# Patient Record
Sex: Female | Born: 1955 | Race: White | Hispanic: No | Marital: Married | Smoking: Never smoker
Health system: Southern US, Community
[De-identification: ages and names within clinical notes are randomized; demographics above are authoritative.]

## PROBLEM LIST (undated history)

## (undated) DIAGNOSIS — N390 Urinary tract infection, site not specified: Secondary | ICD-10-CM

## (undated) DIAGNOSIS — D696 Thrombocytopenia, unspecified: Secondary | ICD-10-CM

## (undated) DIAGNOSIS — B182 Chronic viral hepatitis C: Secondary | ICD-10-CM

## (undated) HISTORY — DX: Thrombocytopenia, unspecified: D69.6

## (undated) HISTORY — DX: Chronic viral hepatitis C: B18.2

## (undated) HISTORY — DX: Urinary tract infection, site not specified: N39.0

---

## 1998-05-30 ENCOUNTER — Other Ambulatory Visit: Admission: RE | Admit: 1998-05-30 | Discharge: 1998-05-30 | Payer: Self-pay | Admitting: Obstetrics and Gynecology

## 1999-12-10 ENCOUNTER — Other Ambulatory Visit: Admission: RE | Admit: 1999-12-10 | Discharge: 1999-12-10 | Payer: Self-pay | Admitting: Obstetrics and Gynecology

## 2000-11-09 ENCOUNTER — Encounter (INDEPENDENT_AMBULATORY_CARE_PROVIDER_SITE_OTHER): Payer: Self-pay

## 2000-11-09 ENCOUNTER — Ambulatory Visit (HOSPITAL_COMMUNITY): Admission: RE | Admit: 2000-11-09 | Discharge: 2000-11-09 | Payer: Self-pay | Admitting: General Surgery

## 2000-12-28 ENCOUNTER — Other Ambulatory Visit: Admission: RE | Admit: 2000-12-28 | Discharge: 2000-12-28 | Payer: Self-pay | Admitting: Obstetrics and Gynecology

## 2002-01-04 ENCOUNTER — Encounter: Payer: Self-pay | Admitting: Obstetrics and Gynecology

## 2002-01-04 ENCOUNTER — Encounter: Admission: RE | Admit: 2002-01-04 | Discharge: 2002-01-04 | Payer: Self-pay | Admitting: Obstetrics and Gynecology

## 2002-03-13 ENCOUNTER — Other Ambulatory Visit: Admission: RE | Admit: 2002-03-13 | Discharge: 2002-03-13 | Payer: Self-pay | Admitting: Obstetrics and Gynecology

## 2002-10-27 ENCOUNTER — Ambulatory Visit (HOSPITAL_COMMUNITY): Admission: RE | Admit: 2002-10-27 | Discharge: 2002-10-27 | Payer: Self-pay | Admitting: *Deleted

## 2003-05-16 ENCOUNTER — Encounter: Admission: RE | Admit: 2003-05-16 | Discharge: 2003-05-16 | Payer: Self-pay | Admitting: Obstetrics and Gynecology

## 2003-06-19 ENCOUNTER — Other Ambulatory Visit: Admission: RE | Admit: 2003-06-19 | Discharge: 2003-06-19 | Payer: Self-pay | Admitting: Obstetrics and Gynecology

## 2003-10-31 ENCOUNTER — Encounter (INDEPENDENT_AMBULATORY_CARE_PROVIDER_SITE_OTHER): Payer: Self-pay | Admitting: Specialist

## 2003-10-31 ENCOUNTER — Ambulatory Visit (HOSPITAL_BASED_OUTPATIENT_CLINIC_OR_DEPARTMENT_OTHER): Admission: RE | Admit: 2003-10-31 | Discharge: 2003-10-31 | Payer: Self-pay | Admitting: General Surgery

## 2003-10-31 ENCOUNTER — Ambulatory Visit (HOSPITAL_COMMUNITY): Admission: RE | Admit: 2003-10-31 | Discharge: 2003-10-31 | Payer: Self-pay | Admitting: General Surgery

## 2003-11-29 ENCOUNTER — Encounter: Admission: RE | Admit: 2003-11-29 | Discharge: 2003-11-29 | Payer: Self-pay | Admitting: *Deleted

## 2004-02-19 ENCOUNTER — Ambulatory Visit: Payer: Self-pay | Admitting: Internal Medicine

## 2004-03-05 ENCOUNTER — Ambulatory Visit: Payer: Self-pay | Admitting: Internal Medicine

## 2004-04-03 ENCOUNTER — Ambulatory Visit: Payer: Self-pay | Admitting: Gastroenterology

## 2005-09-24 ENCOUNTER — Ambulatory Visit: Payer: Self-pay | Admitting: Gastroenterology

## 2005-10-19 ENCOUNTER — Ambulatory Visit (HOSPITAL_COMMUNITY): Admission: RE | Admit: 2005-10-19 | Discharge: 2005-10-19 | Payer: Self-pay | Admitting: Gastroenterology

## 2005-11-30 ENCOUNTER — Ambulatory Visit (HOSPITAL_COMMUNITY): Admission: RE | Admit: 2005-11-30 | Discharge: 2005-11-30 | Payer: Self-pay | Admitting: Gastroenterology

## 2007-07-25 ENCOUNTER — Ambulatory Visit: Payer: Self-pay | Admitting: Family Medicine

## 2007-07-27 ENCOUNTER — Encounter: Admission: RE | Admit: 2007-07-27 | Discharge: 2007-07-27 | Payer: Self-pay | Admitting: Obstetrics and Gynecology

## 2008-07-30 ENCOUNTER — Encounter: Admission: RE | Admit: 2008-07-30 | Discharge: 2008-07-30 | Payer: Self-pay | Admitting: Obstetrics and Gynecology

## 2009-08-19 ENCOUNTER — Encounter: Admission: RE | Admit: 2009-08-19 | Discharge: 2009-08-19 | Payer: Self-pay | Admitting: Obstetrics and Gynecology

## 2009-10-24 ENCOUNTER — Encounter: Payer: Self-pay | Admitting: Family Medicine

## 2009-10-24 LAB — CONVERTED CEMR LAB: Pap Smear: NORMAL

## 2009-10-24 LAB — HM PAP SMEAR

## 2010-05-30 ENCOUNTER — Ambulatory Visit (INDEPENDENT_AMBULATORY_CARE_PROVIDER_SITE_OTHER): Payer: BC Managed Care – PPO | Admitting: Family Medicine

## 2010-05-30 ENCOUNTER — Encounter: Payer: Self-pay | Admitting: Family Medicine

## 2010-05-30 DIAGNOSIS — N390 Urinary tract infection, site not specified: Secondary | ICD-10-CM

## 2010-05-30 DIAGNOSIS — R300 Dysuria: Secondary | ICD-10-CM

## 2010-05-30 DIAGNOSIS — B1710 Acute hepatitis C without hepatic coma: Secondary | ICD-10-CM | POA: Insufficient documentation

## 2010-05-30 LAB — CONVERTED CEMR LAB
Bilirubin Urine: NEGATIVE
Glucose, Urine, Semiquant: NEGATIVE
Ketones, urine, test strip: NEGATIVE
pH: 5

## 2010-05-31 ENCOUNTER — Encounter: Payer: Self-pay | Admitting: Family Medicine

## 2010-06-04 NOTE — Assessment & Plan Note (Signed)
Summary: NEW PT TO EST/?UTI/CLE  [employer]   Vital Signs:  Patient profile:   55 year old female Height:      65 inches Weight:      179.25 pounds BMI:     29.94 Temp:     98.5 degrees F oral Pulse rate:   86 / minute Pulse rhythm:   regular BP sitting:   118 / 82  (right arm) Cuff size:   regular  Vitals Entered By: Linde Gillis CMA Duncan Dull) (May 30, 2010 10:49 AM) CC: new patient, ? UTI   History of Present Illness: New pt here for ?UTI.  Woke up this morning, increased urinary frequency, nausea, chills. +dysuria, +hematuria. No back pain.  No h/o UTIs in past.  Hep C- diagnosed 7 years ago, she is unsure how she was exposed. Followed by [facility] (awaiting records).  No current meds.  Preventive Screening-Counseling & Management  Alcohol-Tobacco     Smoking Status: never  Current Medications (verified): 1)  Cipro 500 Mg Tabs (Ciprofloxacin Hcl) .Marland Kitchen.. 1 By Mouth 2 Times Daily X 7 Days 2)  Pyridium 100 Mg Tabs (Phenazopyridine Hcl) .Marland Kitchen.. 1 Tab By Mouth Three Times A Day X 2 Days  Allergies (verified): No Known Drug Allergies  Past History:  Family History: Last updated: 05/30/2010 Family History High cholesterol  Social History: Last updated: 05/30/2010 Married Never Smoked Alcohol use-yes  Risk Factors: Smoking Status: never (05/30/2010)  Past Medical History: Hepatitis C Thrombocytopenia  Past Surgical History: Denies surgical history  Family History: Family History High cholesterol  Social History: Married Never Smoked Alcohol use-yes Smoking Status:  never  Review of Systems      See HPI General:  Complains of chills. GI:  Complains of abdominal pain and nausea; denies vomiting. GU:  Complains of dysuria, hematuria, and urinary frequency.  Physical Exam  General:  Well-developed,well-nourished,in no acute distress; alert,appropriate and cooperative throughout examination VSS. non toxic appearing Abdomen:  soft.   pos  suprapubic tenderness Msk:  no CVA tenderness Neurologic:  alert & oriented X3 and gait normal.   Skin:  Intact without suspicious lesions or rashes Psych:  Cognition and judgment appear intact. Alert and cooperative with normal attention span and concentration. No apparent delusions, illusions, hallucinations   Impression & Recommendations:  Problem # 1:  URINARY TRACT INFECTION (ICD-599.0) Assessment New complicated.  will treat for presumed pyelonephritis with cipro 500 mg two times a day x 7days. send for culture. pyridium for dysuria. Her updated medication list for this problem includes:    Cipro 500 Mg Tabs (Ciprofloxacin hcl) .Marland Kitchen... 1 by mouth 2 times daily x 7 days    Pyridium 100 Mg Tabs (Phenazopyridine hcl) .Marland Kitchen... 1 tab by mouth three times a day x 2 days  Complete Medication List: 1)  Cipro 500 Mg Tabs (Ciprofloxacin hcl) .Marland Kitchen.. 1 by mouth 2 times daily x 7 days 2)  Pyridium 100 Mg Tabs (Phenazopyridine hcl) .Marland Kitchen.. 1 tab by mouth three times a day x 2 days  Other Orders: Specimen Handling (19147) T-Urine Culture (Spectrum Order) [id number]) UA Dipstick w/o Micro (manual) (81002) Prescriptions: PYRIDIUM 100 MG TABS (PHENAZOPYRIDINE HCL) 1 tab by mouth three times a day x 2 days  #6 x 0   Entered and Authorized by:   Ruthe Mannan MD   Signed by:   Ruthe Mannan MD on 05/30/2010   Method used:   Print then Give to Patient   RxID:   [id number] CIPRO 500 MG TABS (  CIPROFLOXACIN HCL) 1 by mouth 2 times daily x 7 days  #14 x 0   Entered and Authorized by:   Ruthe Mannan MD   Signed by:   Ruthe Mannan MD on 05/30/2010   Method used:   Print then Give to Patient   RxID:   [id number]    Orders Added: 1)  Specimen Handling [99000] 2)  T-Urine Culture (Spectrum Order) [[id number]] 3)  UA Dipstick w/o Micro (manual) [81002] 4)  New Patient Level II [99202]    Prior Medications (reviewed today): None Current Allergies (reviewed today): No known allergies   PAP  Result Date:  10/24/2009 PAP Result:  normal historical  Laboratory Results   Urine Tests  Date/Time Received: May 30, 2010 11:02 AM   Routine Urinalysis   Color: brown Appearance: Cloudy Glucose: negative   (Normal Range: Negative) Bilirubin: negative   (Normal Range: Negative) Ketone: negative   (Normal Range: Negative) Spec. Gravity: >=1.030   (Normal Range: 1.003-1.035) Blood: large   (Normal Range: Negative) pH: 5.0   (Normal Range: 5.0-8.0) Protein: >=300   (Normal Range: Negative) Urobilinogen: 0.2   (Normal Range: 0-1) Nitrite: positive   (Normal Range: Negative) Leukocyte Esterace: large   (Normal Range: Negative)

## 2010-08-29 NOTE — Op Note (Signed)
[facility]  Patient:    Chelsea Park, Chelsea Park                        MRN: [id number] Proc. Date: 11/09/00 Adm. Date:  [id number] Attending:  Carson Myrtle CC:         Eliberto Ivory. Rosalio Macadamia, M.D.   Operative Report  PREOPERATIVE DIAGNOSIS:  Anal stenosis, anal fissure, and external hemorrhoids.  POSTOPERATIVE DIAGNOSIS:  Anal stenosis, anal fissure, and external hemorrhoids.  PROCEDURE:  Repair of anal stenosis and fissure, and external hemorrhoidectomy.  SURGEON:  Timothy E. Earlene Plater, M.D.  ANESTHESIA:  General.  INDICATIONS:  The patient has been seen and followed for some months for chronic recurrent anal pain, discomfort, bleeding, and anal fissure, difficulty with stools.  Even after correction of bowel habits, she is still plagued with symptoms.  After careful explanation, she wishes to proceed.  DESCRIPTION OF PROCEDURE:  The patient was prepared at home and brought to the operating room, and placed supine, and an LMA anesthesia provided.  She was placed in lithotomy.  The perianal carefully inspected, prepped and draped in the usual fashion.  Hemorrhoids were prominent in the left anterior, left posterior, and right posterior positions.  The fissure was posteriorly and partially _______.  Stenosis was moderate.  The anus was injected around and about with Marcaine, epinephrine, and Wydase, and massaged in well.  A left posterior percutaneous internal sphincterotomy accomplished with a 15 blade. This allowed for some further dilatation, and insertion of the operating anoscope.  Each of the external hemorrhoids were successively removed, undermining the edges and closure with 3-0 Prolene.  The left anterior external hemorrhoid was closed circumferentially.  This completed the procedure.  There was no other pathology.  No bleeding or complications. Gelfoam gauze and a dry sterile dressing applied.  She tolerated it well and was removed to the  recovery room in good condition. Written and verbal instructions including Percocet were given to her and her husband, and she will be followed as an outpatient. DD:  11/09/00 TD:  11/09/00 Job: [id number]

## 2010-08-29 NOTE — Op Note (Signed)
Chelsea Park, Chelsea Park                           ACCOUNT NO.:  [id number]   MEDICAL RECORD NO.:  [id number]                   PATIENT TYPE:  AMB   LOCATION:  [facility]                                 FACILITY:  [facility]   PHYSICIAN:  Timothy E. Earlene Plater, M.D.              DATE OF BIRTH:  23-Jan-1956   DATE OF PROCEDURE:  DATE OF DISCHARGE:                                 OPERATIVE REPORT   No dictation.                                               Timothy E. Earlene Plater, M.D.    TED/MEDQ  D:  10/31/2003  T:  10/31/2003  Job:  [id number]

## 2010-08-29 NOTE — Op Note (Signed)
NAMEBOWEN, Chelsea Park                 ACCOUNT NO.:  [id number]   MEDICAL RECORD NO.:  [id number]          PATIENT TYPE:  AMB   LOCATION:  ENDO                         FACILITY:  [facility]   PHYSICIAN:  Petra Kuba, M.D.    DATE OF BIRTH:  Feb 20, 1956   DATE OF PROCEDURE:  11/30/2005  DATE OF DISCHARGE:                                 OPERATIVE REPORT   PROCEDURE:  Esophagogastroduodenoscopy.   INDICATIONS:  Patient with hepatitis C, probable cirrhosis.  Evaluate for  varices.  Consent was signed after risks, benefits, methods, and options  thoroughly discussed in the office.   MEDICINES USED:  Fentanyl 75 mcg, Versed 6 mg.   PROCEDURE:  The video endoscope was inserted by direct vision.  The  esophagus was normal.  The scope was passed into the stomach, advanced  through a normal antrum, normal pylorus, into a normal duodenal bulb and  around the __________ to a normal second portion of the duodenum.  The scope  was withdrawn back to the bulb and a good look there ruled out abnormalities  in that location.  The scope was withdrawn back to the stomach in retroflex.  Body and fundus angularis, lesser and greater curve were evaluated.  Along  the proximal stomach, she did have some mild portal gastropathy but no signs  of gastric varices.  Straight visualization of the stomach confirmed the  above findings.  No additional findings were seen.  Then with suction scope  removed, again a good look at the esophagus ruled out any varices in that  location.  The scope was withdrawn.  Patient tolerated the procedure well  and there was no obvious immediate complication.   ENDOSCOPIC DIAGNOSES:  1. Tiny hiatal hernia, no varices seen.  2. Mild portal gastropathy.  3. Otherwise normal esophagogastroduodenoscopy.   PLAN:  Per Dr. Verta Ellen, have to see back p.r.n.  Probably can hold off on  Inderal for now but would leave that to Dr. Raelene Bott expertise.           ______________________________  Petra Kuba, M.D.     MEM/MEDQ  D:  11/30/2005  T:  11/30/2005  Job:  [id number]   cc:   Juanetta Gosling, [facility]  [facility], M. [facility]

## 2010-08-29 NOTE — Op Note (Signed)
[name], [name]                           ACCOUNT NO.:  [id number]   MEDICAL RECORD NO.:  [id number]                   PATIENT TYPE:  AMB   LOCATION:  [facility]                                 FACILITY:  [facility]   PHYSICIAN:  Timothy E. Earlene Plater, M.D.              DATE OF BIRTH:  1955-04-17   DATE OF PROCEDURE:  10/31/2003  DATE OF DISCHARGE:                                 OPERATIVE REPORT   PREOPERATIVE DIAGNOSES:  Perirectal abscess.   POSTOPERATIVE DIAGNOSES:  Perirectal abscess with fistula.   PROCEDURE:  Examination under anesthesia, drainage of abscess and  fistulotomy.   SURGEON:  Timothy E. Earlene Plater, M.D.   ANESTHESIA:  General.   Ms. Roesler was seen in the office this morning in acute pain with perirectal  pain and an obvious abscess.  Chelsea Park was counseled and prepared for surgery at  this time. Chelsea Park was seen in preop, identified and a permit signed, IV  antibiotics flowing. Chelsea Park was evaluated by anesthesia.   Chelsea Park was taken to the operating room, placed supine, LMA anesthesia provided.  The perianal area was inspected, prepped and draped in the usual fashion. An  obvious abscess was present just to the left of the anterior midline and  upon anoscopy, there was an internal fistulous opening just proximal to the  anoderm in this anterior left anterior position. It was gently probed and  probed subcutaneously into the abscess. The abscess was filleted open, its  edges were trimmed and indeed there was a well formed granulomatous tract  present.  This was debrided and then cauterized. It did not involve the  sphincter. No other disease was seen, however, the vault was full of stool.  The wound was dressed and Gelfoam was applied. Chelsea Park tolerated it well, was  awakened and taken to the recovery room in good condition.   Chelsea Park will be given postoperative antibiotics and pain medication and will be  seen and followed closely in the office.     Timothy E. Earlene Plater, M.D.    TED/MEDQ  D:  10/31/2003  T:  10/31/2003  Job:  [id number]

## 2010-09-03 ENCOUNTER — Other Ambulatory Visit: Payer: Self-pay | Admitting: Obstetrics and Gynecology

## 2010-09-03 DIAGNOSIS — Z1231 Encounter for screening mammogram for malignant neoplasm of breast: Secondary | ICD-10-CM

## 2010-09-10 ENCOUNTER — Ambulatory Visit
Admission: RE | Admit: 2010-09-10 | Discharge: 2010-09-10 | Disposition: A | Discharge status: Home / Self Care | Payer: BC Managed Care – PPO | Source: Ambulatory Visit | Attending: Obstetrics and Gynecology | Admitting: Obstetrics and Gynecology

## 2010-09-10 DIAGNOSIS — Z1231 Encounter for screening mammogram for malignant neoplasm of breast: Secondary | ICD-10-CM

## 2012-05-11 ENCOUNTER — Ambulatory Visit (INDEPENDENT_AMBULATORY_CARE_PROVIDER_SITE_OTHER): Payer: BC Managed Care – PPO | Admitting: Medical

## 2012-05-11 ENCOUNTER — Encounter: Payer: Self-pay | Admitting: Medical

## 2012-05-11 VITALS — BP 130/80 | HR 91 | Temp 99.4°F | Resp 16 | Wt 181.0 lb

## 2012-05-11 DIAGNOSIS — R05 Cough: Secondary | ICD-10-CM

## 2012-05-11 DIAGNOSIS — R059 Cough, unspecified: Secondary | ICD-10-CM

## 2012-05-11 DIAGNOSIS — J329 Chronic sinusitis, unspecified: Secondary | ICD-10-CM

## 2012-05-11 MED ORDER — AMOXICILLIN 875 MG PO TABS: 875.0000 mg | ORAL_TABLET | Freq: Two times a day (BID) | ORAL | Status: DC

## 2012-05-11 NOTE — Progress Notes (Signed)
Subjective: Here for 2 week hx/o head congestion, sinus pressure, cough, scratchy throat, ear fullness. No energy, generally feels weak. Using OTC Alka selter without much improvement.   husband being seen here today for illness as well, but his is more cough and chest symptoms.  She denies SOB, CP, edema, NVD, but has had some fever, aches.  No chills.  No hx/o lung disease.   She does report hx/o Hep C, went through treatment but this was unsuccessful.  She normally sees gynecology for routine yearly f/u, but no recent physical here.  No other aggravating or relieving factors.  No other c/o.  Objective: Filed Vitals:   05/11/12 1346  BP: 130/80  Pulse: 91  Temp: 99.4 F (37.4 C)  Resp: 16    General appearance: Alert, WD/WN, no distress                             Skin: warm, no rash                           Head: + moderate maxillary sinus tenderness,                            Eyes: conjunctiva normal, corneas clear, PERRLA                            Ears: flat TMs, external ear canals normal                          Nose: septum midline, turbinates swollen, with erythema and clear discharge             Mouth/throat: MMM, tongue normal, mild pharyngeal erythema                           Neck: supple, no adenopathy, no thyromegaly, nontender                          Heart: RRR, normal S1, S2, no murmurs                         Lungs: CTA bilaterally, no wheezes, rales, or rhonchi      Assessment and Plan:   Encounter Diagnoses  Name Primary?  . Sinusitis Yes  . Cough     Prescription given for Amoxicillin.  Can use OTC Mucinex DM for congestion, cough.  Tylenol or Ibuprofen OTC for fever and malaise.  Discussed symptomatic relief, nasal saline, and call or return if worse or not improving in 2-3 days.   Return soon for physical.

## 2012-07-20 ENCOUNTER — Telehealth: Payer: Self-pay | Admitting: Family Medicine

## 2012-07-20 NOTE — Telephone Encounter (Signed)
Yes ok to accomodate sooner- not last appt slot of day. Thanks

## 2012-07-20 NOTE — Telephone Encounter (Signed)
Pt sch for 07/27/2012

## 2012-07-20 NOTE — Telephone Encounter (Signed)
Pt is currently working w/[employer] but is transferring to [employer] effective Aug 11, 2012.  [facility] is requiring her to have a CPE and a PPD skin test prior to her start date, however, your 1st available CPE slot is not until 09/15/2012.  Can you accommodate her an appmt prior to May 1st?  Pt says she will be out of town 04/16-18/2014. Thank you.

## 2012-07-21 ENCOUNTER — Other Ambulatory Visit (INDEPENDENT_AMBULATORY_CARE_PROVIDER_SITE_OTHER): Payer: BC Managed Care – PPO

## 2012-07-21 ENCOUNTER — Other Ambulatory Visit: Payer: Self-pay | Admitting: Family Medicine

## 2012-07-21 DIAGNOSIS — Z0000 Encounter for general adult medical examination without abnormal findings: Secondary | ICD-10-CM

## 2012-07-21 DIAGNOSIS — Z136 Encounter for screening for cardiovascular disorders: Secondary | ICD-10-CM

## 2012-07-21 LAB — COMPREHENSIVE METABOLIC PANEL
ALT: 107 U/L — ABNORMAL HIGH (ref 0–35)
Albumin: 3.7 g/dL (ref 3.5–5.2)
Alkaline Phosphatase: 66 U/L (ref 39–117)
CO2: 28 mEq/L (ref 19–32)
GFR: 76.35 mL/min (ref >60.00)
Potassium: 4.2 mEq/L (ref 3.5–5.1)
Sodium: 141 mEq/L (ref 135–145)
Total Bilirubin: 1 mg/dL (ref 0.3–1.2)
Total Protein: 7.4 g/dL (ref 6.0–8.3)

## 2012-07-21 LAB — CBC WITH DIFFERENTIAL/PLATELET
Eosinophils Relative: 4 % (ref 0.0–5.0)
Lymphocytes Relative: 24.3 % (ref 12.0–46.0)
Lymphs Abs: 0.9 10*3/uL (ref 0.7–4.0)

## 2012-07-21 LAB — LIPID PANEL
Cholesterol: 191 mg/dL (ref 0–200)
LDL Cholesterol: 110 mg/dL — ABNORMAL HIGH (ref 0–99)
Total CHOL/HDL Ratio: 3
Triglycerides: 97 mg/dL (ref 0.0–149.0)

## 2012-07-27 ENCOUNTER — Ambulatory Visit (INDEPENDENT_AMBULATORY_CARE_PROVIDER_SITE_OTHER): Payer: BC Managed Care – PPO | Admitting: Family Medicine

## 2012-07-27 ENCOUNTER — Encounter: Payer: Self-pay | Admitting: Family Medicine

## 2012-07-27 ENCOUNTER — Other Ambulatory Visit (HOSPITAL_COMMUNITY)
Admission: RE | Admit: 2012-07-27 | Discharge: 2012-07-27 | Disposition: A | Discharge status: Home / Self Care | Payer: BC Managed Care – PPO | Source: Ambulatory Visit | Attending: Family Medicine | Admitting: Family Medicine

## 2012-07-27 VITALS — BP 116/80 | HR 72 | Temp 98.2°F | Ht 65.0 in | Wt 181.8 lb

## 2012-07-27 DIAGNOSIS — D696 Thrombocytopenia, unspecified: Secondary | ICD-10-CM

## 2012-07-27 DIAGNOSIS — Z01419 Encounter for gynecological examination (general) (routine) without abnormal findings: Secondary | ICD-10-CM | POA: Insufficient documentation

## 2012-07-27 DIAGNOSIS — Z0000 Encounter for general adult medical examination without abnormal findings: Secondary | ICD-10-CM

## 2012-07-27 DIAGNOSIS — Z1239 Encounter for other screening for malignant neoplasm of breast: Secondary | ICD-10-CM

## 2012-07-27 DIAGNOSIS — Z1211 Encounter for screening for malignant neoplasm of colon: Secondary | ICD-10-CM

## 2012-07-27 DIAGNOSIS — Z1151 Encounter for screening for human papillomavirus (HPV): Secondary | ICD-10-CM | POA: Insufficient documentation

## 2012-07-27 DIAGNOSIS — B1710 Acute hepatitis C without hepatic coma: Secondary | ICD-10-CM

## 2012-07-27 DIAGNOSIS — Z1231 Encounter for screening mammogram for malignant neoplasm of breast: Secondary | ICD-10-CM

## 2012-07-27 LAB — CBC WITH DIFFERENTIAL/PLATELET
Basophils Absolute: 0 10*3/uL (ref 0.0–0.1)
Basophils Relative: 0.6 % (ref 0.0–3.0)
Eosinophils Relative: 2 % (ref 0.0–5.0)
HCT: 43.8 % (ref 36.0–46.0)
Lymphocytes Relative: 21.8 % (ref 12.0–46.0)
MCHC: 33.9 g/dL (ref 30.0–36.0)
Monocytes Relative: 13 % — ABNORMAL HIGH (ref 3.0–12.0)
Neutro Abs: 2.3 10*3/uL (ref 1.4–7.7)
Neutrophils Relative %: 62.6 % (ref 43.0–77.0)
RDW: 13.6 % (ref 11.5–14.6)
WBC: 3.7 10*3/uL — ABNORMAL LOW (ref 4.5–10.5)

## 2012-07-27 NOTE — Addendum Note (Signed)
Addended by: Alvina Chou on: 07/27/2012 02:58 PM   Modules accepted: Orders

## 2012-07-27 NOTE — Progress Notes (Signed)
Subjective:    Patient ID: Chelsea Park, female    DOB: 29-Apr-1955, 57 y.o.   MRN: [id number]  HPI  74 G3P3 yo very pleasant female who has not been seen by me since she established care over 2 years ago, here for CPX.  No h/o abnormal pap smears in past 5 years.  Due for pap smear and mammogram.  She is currently working  With [employer] but is transferring to [employer] effective Aug 11, 2012. [facility] is requiring her to have a CPE and a PPD skin test prior to her start date which is why she is here today.  No family history of breast, uterine or cervical CA.  No postmenopausal bleeding (LMP 13 years ago).  Denies any vaginal complaints.  Has never had a colonoscopy.  No family h/o colon CA.  Denies any changes in her bowel habits or blood in her stool.   Hep C- diagnosed 9 years ago, she is unsure how she was exposed.  Followed by [facility] hepatology clinic. No current meds.  LFTS are elevated today.  Has not followed up with hepatology in several years.  Lab Results  Component Value Date   ALT 107* 07/21/2012   AST 75* 07/21/2012   ALKPHOS 66 07/21/2012   BILITOT 1.0 07/21/2012   H/o thrombocytopenia- diagnosed over 20 years ago.  Had to have steroids during pregnancy, otherwise no issues. Still has her spleen.  Denies any bleeding. Lab Results  Component Value Date   WBC 3.5* 07/21/2012   HGB 14.3 07/21/2012   HCT 41.9 07/21/2012   MCV 90.7 07/21/2012   PLT 66.0* 07/21/2012       Review of Systems See HPI Patient reports no  vision/ hearing changes,anorexia, weight change, fever ,adenopathy, persistant / recurrent hoarseness, swallowing issues, chest pain, edema,persistant / recurrent cough, hemoptysis, dyspnea(rest, exertional, paroxysmal nocturnal), gastrointestinal  bleeding (melena, rectal bleeding), abdominal pain, excessive heart burn, GU symptoms(dysuria, hematuria, pyuria, voiding/incontinence  Issues) syncope, focal weakness, severe  memory loss, concerning skin lesions, depression, anxiety, abnormal bruising/bleeding, major joint swelling, breast masses or abnormal vaginal bleeding.       Objective:   Physical Exam BP 116/80  Pulse 72  Temp(Src) 98.2 F (36.8 C) (Oral)  Ht 5\' 5"  (1.651 m)  Wt 181 lb 12 oz (82.441 kg)  BMI 30.24 kg/m2  SpO2 97%  General:  Well-developed,well-nourished,in no acute distress; alert,appropriate and cooperative throughout examination Head:  normocephalic and atraumatic.   Eyes:  vision grossly intact, pupils equal, pupils round, and pupils reactive to light.   Ears:  R ear normal and L ear normal.   Nose:  no external deformity.   Mouth:  good dentition.   Neck:  No deformities, masses, or tenderness noted. Breasts:  No mass, nodules, thickening, tenderness, bulging, retraction, inflamation, nipple discharge or skin changes noted.   Lungs:  Normal respiratory effort, chest expands symmetrically. Lungs are clear to auscultation, no crackles or wheezes. Heart:  Normal rate and regular rhythm. S1 and S2 normal without gallop, murmur, click, rub or other extra sounds. Abdomen:  Bowel sounds positive,abdomen soft and non-tender without masses, organomegaly or hernias noted. Rectal:  no external abnormalities.   Genitalia:  Pelvic Exam:        External: normal female genitalia without lesions or masses        Vagina: normal without lesions or masses        Cervix: normal without lesions or masses  Adnexa: normal bimanual exam without masses or fullness        Uterus: normal by palpation        Pap smear: performed Msk:  No deformity or scoliosis noted of thoracic or lumbar spine.   Extremities:  No clubbing, cyanosis, edema, or deformity noted with normal full range of motion of all joints.   Neurologic:  alert & oriented X3 and gait normal.   Skin:  Intact without suspicious lesions or rashes Cervical Nodes:  No lymphadenopathy noted Axillary Nodes:  No palpable  lymphadenopathy Psych:  Cognition and judgment appear intact. Alert and cooperative with normal attention span and concentration. No apparent delusions, illusions, hallucinations      Assessment & Plan:   1. Routine general medical examination at a health care facility Reviewed preventive care protocols, scheduled due services, and updated immunizations Discussed nutrition, exercise, diet, and healthy lifestyle.  - Cytology - PAP  2. HEPATITIS C Refer back to hepatitis C clinic.  The patient indicates understanding of these issues and agrees with the plan.  - AMB referral to hepatitis C clinic  3. Special screening for malignant neoplasms, colon She declines colonoscopy but willing to use IFOB. - Fecal occult blood, imunochemical; Future  4. Thrombocytopenia, unspecified Chronic.  No bleeding.  No rx.  5. Screening for breast cancer  - MM Digital Screening; Future

## 2012-07-27 NOTE — Patient Instructions (Addendum)
Nice to see you. Have a great time in [place] and good luck with your new job. Come back next week for your TB skin test.  Please stop by the lab to get your stool cards. Shirlee Limerick can call you about your liver center referral.

## 2012-07-27 NOTE — Addendum Note (Signed)
Addended by: Dianne Dun on: 07/27/2012 10:14 AM   Modules accepted: Orders

## 2012-07-28 LAB — HEPATITIS C ANTIBODY: HCV Ab: REACTIVE — AB

## 2012-07-28 LAB — HEPATITIS C RNA QUANTITATIVE: HCV Quantitative: 4259314 IU/mL — ABNORMAL HIGH (ref <15)

## 2012-08-03 ENCOUNTER — Encounter: Payer: Self-pay | Admitting: Family Medicine

## 2012-08-03 ENCOUNTER — Ambulatory Visit (INDEPENDENT_AMBULATORY_CARE_PROVIDER_SITE_OTHER): Payer: BC Managed Care – PPO | Admitting: *Deleted

## 2012-08-03 DIAGNOSIS — Z111 Encounter for screening for respiratory tuberculosis: Secondary | ICD-10-CM

## 2012-08-03 LAB — HM PAP SMEAR: HM Pap smear: NORMAL

## 2012-08-04 ENCOUNTER — Other Ambulatory Visit (INDEPENDENT_AMBULATORY_CARE_PROVIDER_SITE_OTHER): Payer: BC Managed Care – PPO

## 2012-08-04 DIAGNOSIS — Z1211 Encounter for screening for malignant neoplasm of colon: Secondary | ICD-10-CM

## 2012-08-04 LAB — FECAL OCCULT BLOOD, IMMUNOCHEMICAL: Fecal Occult Bld: POSITIVE

## 2012-08-05 ENCOUNTER — Other Ambulatory Visit: Payer: Self-pay | Admitting: Family Medicine

## 2012-08-05 DIAGNOSIS — R195 Other fecal abnormalities: Secondary | ICD-10-CM

## 2012-08-29 ENCOUNTER — Ambulatory Visit
Admission: RE | Admit: 2012-08-29 | Discharge: 2012-08-29 | Disposition: A | Discharge status: Home / Self Care | Payer: BC Managed Care – PPO | Source: Ambulatory Visit | Attending: Family Medicine | Admitting: Family Medicine

## 2012-08-29 DIAGNOSIS — Z1231 Encounter for screening mammogram for malignant neoplasm of breast: Secondary | ICD-10-CM

## 2012-09-02 ENCOUNTER — Other Ambulatory Visit (INDEPENDENT_AMBULATORY_CARE_PROVIDER_SITE_OTHER): Payer: BC Managed Care – PPO

## 2012-09-02 DIAGNOSIS — R195 Other fecal abnormalities: Secondary | ICD-10-CM

## 2012-09-02 LAB — FECAL OCCULT BLOOD, IMMUNOCHEMICAL: Fecal Occult Bld: POSITIVE

## 2012-09-06 ENCOUNTER — Other Ambulatory Visit: Payer: Self-pay | Admitting: Family Medicine

## 2012-09-06 DIAGNOSIS — R195 Other fecal abnormalities: Secondary | ICD-10-CM

## 2012-09-20 ENCOUNTER — Telehealth: Payer: Self-pay | Admitting: Family Medicine

## 2012-09-20 NOTE — Telephone Encounter (Signed)
Received a Referral form that your patient will be seen by Dr Jacqualine Mau Hep C Dr at [facility] 09/22/2012 at 3:30pm. [facility]

## 2013-08-30 ENCOUNTER — Encounter: Payer: Self-pay | Admitting: Internal Medicine

## 2013-08-30 ENCOUNTER — Ambulatory Visit (INDEPENDENT_AMBULATORY_CARE_PROVIDER_SITE_OTHER): Payer: BC Managed Care – PPO | Admitting: Internal Medicine

## 2013-08-30 VITALS — BP 112/72 | HR 59 | Temp 98.4°F | Wt 171.5 lb

## 2013-08-30 DIAGNOSIS — R300 Dysuria: Secondary | ICD-10-CM

## 2013-08-30 DIAGNOSIS — N3090 Cystitis, unspecified without hematuria: Secondary | ICD-10-CM

## 2013-08-30 LAB — POCT URINALYSIS DIPSTICK
Blood, UA: NEGATIVE
Glucose, UA: NEGATIVE
Ketones, UA: NEGATIVE
LEUKOCYTES UA: NEGATIVE
NITRITE UA: NEGATIVE
PH UA: 6
Protein, UA: NEGATIVE
SPEC GRAV UA: 1.015
Urobilinogen, UA: 0.2

## 2013-08-30 NOTE — Addendum Note (Signed)
Addended by: Roena MaladyEVONTENNO, Israella Hubert Y on: 08/30/2013 04:41 PM   Modules accepted: Orders

## 2013-08-30 NOTE — Progress Notes (Signed)
Pre visit review using our clinic review tool, if applicable. No additional management support is needed unless otherwise documented below in the visit note. 

## 2013-08-30 NOTE — Progress Notes (Signed)
HPI  Pt presents to the clinic today with c/o dysuria. She reports this started 1 week ago. She denies fever, chills, nausea, vomiting or low back pain. She has tried to increase her fluids. She has had UTI's in the past and reports this feels the same.   Review of Systems  Past Medical History  Diagnosis Date  . Hepatitis C, chronic   . Urinary tract infection, site not specified   . Thrombocytopenia     No family history on file.  History   Social History  . Marital Status: Married    Spouse Name: N/A    Number of Children: N/A  . Years of Education: N/A   Occupational History  . Not on file.   Social History Main Topics  . Smoking status: Never Smoker   . Smokeless tobacco: Not on file  . Alcohol Use: Yes     Comment: occasional  . Drug Use: Not on file  . Sexual Activity: Not on file   Other Topics Concern  . Not on file   Social History Narrative  . No narrative on file    No Known Allergies  Constitutional: Denies fever, malaise, fatigue, headache or abrupt weight changes.   GU: Pt reports pain with urination. Denies urgency, frequency, burning sensation, blood in urine, odor or discharge. Skin: Denies redness, rashes, lesions or ulcercations.   No other specific complaints in a complete review of systems (except as listed in HPI above).    Objective:   Physical Exam  Wt 171 lb 8 oz (77.792 kg) Wt Readings from Last 3 Encounters:  08/30/13 171 lb 8 oz (77.792 kg)  07/27/12 181 lb 12 oz (82.441 kg)  05/11/12 181 lb (82.101 kg)    General: Appears her stated age, well developed, well nourished in NAD. Cardiovascular: Normal rate and rhythm. S1,S2 noted.  No murmur, rubs or gallops noted. No JVD or BLE edema. No carotid bruits noted. Pulmonary/Chest: Normal effort and positive vesicular breath sounds. No respiratory distress. No wheezes, rales or ronchi noted.  Abdomen: Soft and nontender. Normal bowel sounds, no bruits noted. No distention or masses  noted. Liver, spleen and kidneys non palpable. Tender to palpation over the bladder area. No CVA tenderness.      Assessment & Plan:   Dysuria secondary to ? cystitis  Urinalysis: moderate bilirubin OK to take AZO OTC Drink plenty of fluids  RTC as needed or if symptoms persist.

## 2013-08-30 NOTE — Patient Instructions (Addendum)

## 2013-09-18 ENCOUNTER — Ambulatory Visit (INDEPENDENT_AMBULATORY_CARE_PROVIDER_SITE_OTHER): Payer: BC Managed Care – PPO | Admitting: Internal Medicine

## 2013-09-18 ENCOUNTER — Ambulatory Visit (INDEPENDENT_AMBULATORY_CARE_PROVIDER_SITE_OTHER)
Admission: RE | Admit: 2013-09-18 | Discharge: 2013-09-18 | Disposition: A | Discharge status: Home / Self Care | Payer: BC Managed Care – PPO | Source: Ambulatory Visit | Attending: Internal Medicine | Admitting: Internal Medicine

## 2013-09-18 ENCOUNTER — Encounter: Payer: Self-pay | Admitting: Internal Medicine

## 2013-09-18 VITALS — BP 118/76 | HR 57 | Temp 98.6°F | Wt 174.5 lb

## 2013-09-18 DIAGNOSIS — M79672 Pain in left foot: Secondary | ICD-10-CM

## 2013-09-18 DIAGNOSIS — M79609 Pain in unspecified limb: Secondary | ICD-10-CM

## 2013-09-18 NOTE — Patient Instructions (Addendum)
RICE: Routine Care for Injuries The routine care of many injuries includes Rest, Ice, Compression, and Elevation (RICE). HOME CARE INSTRUCTIONS  Rest is needed to allow your body to heal. Routine activities can usually be resumed when comfortable. Injured tendons and bones can take up to 6 weeks to heal. Tendons are the cord-like structures that attach muscle to bone.  Ice following an injury helps keep the swelling down and reduces pain.  Put ice in a plastic bag.  Place a towel between your skin and the bag.  Leave the ice on for 15-20 minutes, 03-04 times a day. Do this while awake, for the first 24 to 48 hours. After that, continue as directed by your caregiver.  Compression helps keep swelling down. It also gives support and helps with discomfort. If an elastic bandage has been applied, it should be removed and reapplied every 3 to 4 hours. It should not be applied tightly, but firmly enough to keep swelling down. Watch fingers or toes for swelling, bluish discoloration, coldness, numbness, or excessive pain. If any of these problems occur, remove the bandage and reapply loosely. Contact your caregiver if these problems continue.  Elevation helps reduce swelling and decreases pain. With extremities, such as the arms, hands, legs, and feet, the injured area should be placed near or above the level of the heart, if possible. SEEK IMMEDIATE MEDICAL CARE IF:  You have persistent pain and swelling.  You develop redness, numbness, or unexpected weakness.  Your symptoms are getting worse rather than improving after several days. These symptoms may indicate that further evaluation or further X-rays are needed. Sometimes, X-rays may not show a small broken bone (fracture) until 1 week or 10 days later. Make a follow-up appointment with your caregiver. Ask when your X-ray results will be ready. Make sure you get your X-ray results. Document Released: 07/12/2000 Document Revised: 06/22/2011  Document Reviewed: 08/29/2010 ExitCare Patient Information 2014 [employer].  

## 2013-09-18 NOTE — Progress Notes (Signed)
Subjective:    Patient ID: Chelsea Park, female    DOB: 08/16/1955, 58 y.o.   MRN: [id number]  HPI  Pt presents to the clinic today with c/o left foot pain. She reports this started 1 week ago. She was out side gardening when she stepped in a hole and rolled her ankle. She has noticed some swelling on the lateral side of her left foot. She is able to bear weight on it. She did apply ice to the affected are and took tylenol with only minimal relief. She is concerned because the pain has not improved. She denies numbness and tingling in the foot.  Review of Systems   Past Medical History  Diagnosis Date  . Hepatitis C, chronic   . Urinary tract infection, site not specified   . Thrombocytopenia     No current outpatient prescriptions on file.   No current facility-administered medications for this visit.    No Known Allergies  No family history on file.  History   Social History  . Marital Status: Married    Spouse Name: N/A    Number of Children: N/A  . Years of Education: N/A   Occupational History  . Not on file.   Social History Main Topics  . Smoking status: Never Smoker   . Smokeless tobacco: Not on file  . Alcohol Use: Yes     Comment: occasional  . Drug Use: Not on file  . Sexual Activity: Not on file   Other Topics Concern  . Not on file   Social History Narrative  . No narrative on file     Constitutional: Denies fever, malaise, fatigue, headache or abrupt weight changes.  Musculoskeletal: Pt reports left foot pain. Denies  difficulty with gait, muscle pain or joint pain and swelling.     No other specific complaints in a complete review of systems (except as listed in HPI above).     Objective:   Physical Exam   BP 118/76  Pulse 57  Temp(Src) 98.6 F (37 C) (Oral)  Wt 174 lb 8 oz (79.153 kg)  SpO2 99% Wt Readings from Last 3 Encounters:  09/18/13 174 lb 8 oz (79.153 kg)  08/30/13 171 lb 8 oz (77.792 kg)  07/27/12 181 lb 12 oz  (82.441 kg)    General: Appears her stated age, well developed, well nourished in NAD. Cardiovascular: Normal rate and rhythm. S1,S2 noted.  No murmur, rubs or gallops noted. No JVD or BLE edema. No carotid bruits noted. Pulmonary/Chest: Normal effort and positive vesicular breath sounds. No respiratory distress. No wheezes, rales or ronchi noted.  Musculoskeletal: Normal flexion and extension of the left ankle. No pain with palpation along the lateral ligaments. Pinpoint pain over the left proximal metatarsal.   BMET    Component Value Date/Time   NA 141 07/21/2012 0811   K 4.2 07/21/2012 0811   CL 106 07/21/2012 0811   CO2 28 07/21/2012 0811   GLUCOSE 108* 07/21/2012 0811   BUN 13 07/21/2012 0811   CREATININE 0.8 07/21/2012 0811   CALCIUM 9.0 07/21/2012 0811    Lipid Panel     Component Value Date/Time   CHOL 191 07/21/2012 0811   TRIG 97.0 07/21/2012 0811   HDL 62.00 07/21/2012 0811   CHOLHDL 3 07/21/2012 0811   VLDL 19.4 07/21/2012 0811   LDLCALC 110* 07/21/2012 0811    CBC    Component Value Date/Time   WBC 3.7* 07/27/2012 1457   RBC 4.81 07/27/2012  1457   HGB 14.9 07/27/2012 1457   HCT 43.8 07/27/2012 1457   PLT 63.0* 07/27/2012 1457   MCV 91.0 07/27/2012 1457   MCHC 33.9 07/27/2012 1457   RDW 13.6 07/27/2012 1457   LYMPHSABS 0.8 07/27/2012 1457   MONOABS 0.5 07/27/2012 1457   EOSABS 0.1 07/27/2012 1457   BASOSABS 0.0 07/27/2012 1457    Hgb A1C No results found for this basename: HGBA1C        Assessment & Plan:   Left foot pain:  Will check xray to r/o fracture Weight bearing as tolerated on that side Instructions given for RICE May need referral to Dr. Patsy Lager or orthopedics for further evaluation/intervention  Will follow up after xray is back

## 2013-09-18 NOTE — Progress Notes (Signed)
Pre visit review using our clinic review tool, if applicable. No additional management support is needed unless otherwise documented below in the visit note. 

## 2013-09-26 ENCOUNTER — Ambulatory Visit (INDEPENDENT_AMBULATORY_CARE_PROVIDER_SITE_OTHER): Payer: BC Managed Care – PPO | Admitting: Internal Medicine

## 2013-09-26 ENCOUNTER — Encounter: Payer: Self-pay | Admitting: Internal Medicine

## 2013-09-26 VITALS — BP 110/58 | HR 71 | Temp 98.1°F | Wt 175.5 lb

## 2013-09-26 DIAGNOSIS — J069 Acute upper respiratory infection, unspecified: Secondary | ICD-10-CM

## 2013-09-26 MED ORDER — AZITHROMYCIN 250 MG PO TABS: ORAL_TABLET | ORAL | Status: DC

## 2013-09-26 NOTE — Progress Notes (Signed)
HPI  Pt presents to the clinic today with c/o cough, sore throat, and ear pain. This started 1 week ago. The cough is non productive. She tends to cough more of night. She denies fever, chills or body aches. She has not tried anything OTC. She has tried warm salt water gargle. She has no history of seasonal allergies. She has not had sick contacts that she is aware of.  Review of Systems      Past Medical History  Diagnosis Date  . Hepatitis C, chronic   . Urinary tract infection, site not specified   . Thrombocytopenia     History reviewed. No pertinent family history.  History   Social History  . Marital Status: Married    Spouse Name: N/A    Number of Children: N/A  . Years of Education: N/A   Occupational History  . Not on file.   Social History Main Topics  . Smoking status: Never Smoker   . Smokeless tobacco: Not on file  . Alcohol Use: Yes     Comment: occasional  . Drug Use: Not on file  . Sexual Activity: Not on file   Other Topics Concern  . Not on file   Social History Narrative  . No narrative on file    No Known Allergies   Constitutional:  Denies headache, fatigue, fever or abrupt weight changes.  HEENT:  Positive ear pain, sore throat. Denies eye redness, eye pain, pressure behind the eyes, facial pain, nasal congestion, ringing in the ears, wax buildup, runny nose or bloody nose. Respiratory: Positive cough. Denies difficulty breathing or shortness of breath.  Cardiovascular: Denies chest pain, chest tightness, palpitations or swelling in the hands or feet.   No other specific complaints in a complete review of systems (except as listed in HPI above).  Objective:   BP 110/58  Pulse 71  Temp(Src) 98.1 F (36.7 C) (Oral)  Wt 175 lb 8 oz (79.606 kg)  SpO2 98% Wt Readings from Last 3 Encounters:  09/26/13 175 lb 8 oz (79.606 kg)  09/18/13 174 lb 8 oz (79.153 kg)  08/30/13 171 lb 8 oz (77.792 kg)     General: Appears her stated age, well  developed, well nourished in NAD. HEENT: Head: normal shape and size; Eyes: sclera white, no icterus, conjunctiva pink, PERRLA and EOMs intact; Ears: Tm's gray but intact, distorted light reflex; Nose: mucosa pink and moist, septum midline; Throat/Mouth: + PND. Teeth present, mucosa erythematous and moist, no exudate noted, no lesions or ulcerations noted.  Neck: Neck supple, trachea midline. No massses, lumps or thyromegaly present.  Cardiovascular: Normal rate and rhythm. S1,S2 noted.  No murmur, rubs or gallops noted. No JVD or BLE edema. No carotid bruits noted. Pulmonary/Chest: Normal effort and positive vesicular breath sounds. No respiratory distress. No wheezes, rales or ronchi noted.      Assessment & Plan:   Upper respiratory infection:  Get some rest and drink plenty of water Do salt water gargles for the sore throat eRx for Azithromax Ibuprofen for pain/inflammation  RTC as needed or if symptoms persist.

## 2013-09-26 NOTE — Patient Instructions (Addendum)
Otalgia °The most common reason for this in children is an infection of the middle ear. Pain from the middle ear is usually caused by a build-up of fluid and pressure behind the eardrum. Pain from an earache can be sharp, dull, or burning. The pain may be temporary or constant. The middle ear is connected to the nasal passages by a short narrow tube called the Eustachian tube. The Eustachian tube allows fluid to drain out of the middle ear, and helps keep the pressure in your ear equalized. °CAUSES  °A cold or allergy can block the Eustachian tube with inflammation and the build-up of secretions. This is especially likely in small children, because their Eustachian tube is shorter and more horizontal. When the Eustachian tube closes, the normal flow of fluid from the middle ear is stopped. Fluid can accumulate and cause stuffiness, pain, hearing loss, and an ear infection if germs start growing in this area. °SYMPTOMS  °The symptoms of an ear infection may include fever, ear pain, fussiness, increased crying, and irritability. Many children will have temporary and minor hearing loss during and right after an ear infection. Permanent hearing loss is rare, but the risk increases the more infections a child has. Other causes of ear pain include retained water in the outer ear canal from swimming and bathing. °Ear pain in adults is less likely to be from an ear infection. Ear pain may be referred from other locations. Referred pain may be from the joint between your jaw and the skull. It may also come from a tooth problem or problems in the neck. Other causes of ear pain include: °· A foreign body in the ear. °· Outer ear infection. °· Sinus infections. °· Impacted ear wax. °· Ear injury. °· Arthritis of the jaw or TMJ problems. °· Middle ear infection. °· Tooth infections. °· Sore throat with pain to the ears. °DIAGNOSIS  °Your caregiver can usually make the diagnosis by examining you. Sometimes other special studies,  including x-rays and lab work may be necessary. °TREATMENT  °· If antibiotics were prescribed, use them as directed and finish them even if you or your child's symptoms seem to be improved. °· Sometimes PE tubes are needed in children. These are little plastic tubes which are put into the eardrum during a simple surgical procedure. They allow fluid to drain easier and allow the pressure in the middle ear to equalize. This helps relieve the ear pain caused by pressure changes. °HOME CARE INSTRUCTIONS  °· Only take over-the-counter or prescription medicines for pain, discomfort, or fever as directed by your caregiver. DO NOT GIVE CHILDREN ASPIRIN because of the association of Reye's Syndrome in children taking aspirin. °· Use a cold pack applied to the outer ear for 15-20 minutes, 03-04 times per day or as needed may reduce pain. Do not apply ice directly to the skin. You may cause frost bite. °· Over-the-counter ear drops used as directed may be effective. Your caregiver may sometimes prescribe ear drops. °· Resting in an upright position may help reduce pressure in the middle ear and relieve pain. °· Ear pain caused by rapidly descending from high altitudes can be relieved by swallowing or chewing gum. Allowing infants to suck on a bottle during airplane travel can help. °· Do not smoke in the house or near children. If you are unable to quit smoking, smoke outside. °· Control allergies. °SEEK IMMEDIATE MEDICAL CARE IF:  °· You or your child are becoming sicker. °· Pain or fever   relief is not obtained with medicine. °· You or your child's symptoms (pain, fever, or irritability) do not improve within 24 to 48 hours or as instructed. °· Severe pain suddenly stops hurting. This may indicate a ruptured eardrum. °· You or your children develop new problems such as severe headaches, stiff neck, difficulty swallowing, or swelling of the face or around the ear. °Document Released: 11/15/2003 Document Revised: 06/22/2011  Document Reviewed: 03/21/2008 °ExitCare® Patient Information ©2014 [employer]. ° °

## 2013-09-26 NOTE — Progress Notes (Signed)
Pre visit review using our clinic review tool, if applicable. No additional management support is needed unless otherwise documented below in the visit note. 

## 2013-10-27 ENCOUNTER — Other Ambulatory Visit: Payer: Self-pay | Admitting: Family Medicine

## 2013-10-27 DIAGNOSIS — Z0000 Encounter for general adult medical examination without abnormal findings: Secondary | ICD-10-CM

## 2013-10-27 DIAGNOSIS — Z136 Encounter for screening for cardiovascular disorders: Secondary | ICD-10-CM

## 2013-10-27 DIAGNOSIS — D696 Thrombocytopenia, unspecified: Secondary | ICD-10-CM

## 2013-11-01 ENCOUNTER — Other Ambulatory Visit (INDEPENDENT_AMBULATORY_CARE_PROVIDER_SITE_OTHER): Payer: BC Managed Care – PPO

## 2013-11-01 DIAGNOSIS — Z0000 Encounter for general adult medical examination without abnormal findings: Secondary | ICD-10-CM

## 2013-11-01 DIAGNOSIS — D696 Thrombocytopenia, unspecified: Secondary | ICD-10-CM

## 2013-11-01 DIAGNOSIS — Z136 Encounter for screening for cardiovascular disorders: Secondary | ICD-10-CM

## 2013-11-01 LAB — CBC WITH DIFFERENTIAL/PLATELET
BASOS ABS: 0 10*3/uL (ref 0.0–0.1)
Basophils Relative: 0.3 % (ref 0.0–3.0)
EOS ABS: 0.1 10*3/uL (ref 0.0–0.7)
Eosinophils Relative: 3.5 % (ref 0.0–5.0)
HCT: 41.8 % (ref 36.0–46.0)
Hemoglobin: 14.4 g/dL (ref 12.0–15.0)
LYMPHS PCT: 21.4 % (ref 12.0–46.0)
Lymphs Abs: 0.9 10*3/uL (ref 0.7–4.0)
MCHC: 34.5 g/dL (ref 30.0–36.0)
MCV: 90.9 fl (ref 78.0–100.0)
MONOS PCT: 10.5 % (ref 3.0–12.0)
Monocytes Absolute: 0.4 10*3/uL (ref 0.1–1.0)
NEUTROS PCT: 64.3 % (ref 43.0–77.0)
Neutro Abs: 2.6 10*3/uL (ref 1.4–7.7)
PLATELETS: 72 10*3/uL — AB (ref 150.0–400.0)
RBC: 4.6 Mil/uL (ref 3.87–5.11)
RDW: 14 % (ref 11.5–15.5)
WBC: 4.1 10*3/uL (ref 4.0–10.5)

## 2013-11-01 LAB — COMPREHENSIVE METABOLIC PANEL
ALT: 21 U/L (ref 0–35)
AST: 23 U/L (ref 0–37)
Albumin: 3.9 g/dL (ref 3.5–5.2)
Alkaline Phosphatase: 59 U/L (ref 39–117)
BUN: 20 mg/dL (ref 6–23)
CALCIUM: 9.1 mg/dL (ref 8.4–10.5)
CHLORIDE: 103 meq/L (ref 96–112)
CO2: 27 meq/L (ref 19–32)
Creatinine, Ser: 0.8 mg/dL (ref 0.4–1.2)
GFR: 81.73 mL/min (ref >60.00)
Glucose, Bld: 90 mg/dL (ref 70–99)
POTASSIUM: 4.3 meq/L (ref 3.5–5.1)
SODIUM: 138 meq/L (ref 135–145)
TOTAL PROTEIN: 7.7 g/dL (ref 6.0–8.3)
Total Bilirubin: 0.7 mg/dL (ref 0.2–1.2)

## 2013-11-01 LAB — LIPID PANEL
CHOL/HDL RATIO: 3
Cholesterol: 224 mg/dL — ABNORMAL HIGH (ref 0–200)
HDL: 67 mg/dL (ref >39.00)
LDL Cholesterol: 141 mg/dL — ABNORMAL HIGH (ref 0–99)
NONHDL: 157
Triglycerides: 82 mg/dL (ref 0.0–149.0)
VLDL: 16.4 mg/dL (ref 0.0–40.0)

## 2013-11-01 LAB — TSH: TSH: 1.77 u[IU]/mL (ref 0.35–4.50)

## 2013-11-03 ENCOUNTER — Encounter: Payer: Self-pay | Admitting: Family Medicine

## 2013-11-03 ENCOUNTER — Other Ambulatory Visit: Payer: Self-pay

## 2013-11-03 ENCOUNTER — Other Ambulatory Visit (HOSPITAL_COMMUNITY)
Admission: RE | Admit: 2013-11-03 | Discharge: 2013-11-03 | Disposition: A | Discharge status: Home / Self Care | Payer: BC Managed Care – PPO | Source: Ambulatory Visit | Attending: Family Medicine | Admitting: Family Medicine

## 2013-11-03 ENCOUNTER — Ambulatory Visit (INDEPENDENT_AMBULATORY_CARE_PROVIDER_SITE_OTHER): Payer: BC Managed Care – PPO | Admitting: Family Medicine

## 2013-11-03 VITALS — BP 126/74 | HR 56 | Temp 98.1°F | Ht 64.75 in | Wt 179.0 lb

## 2013-11-03 DIAGNOSIS — B1710 Acute hepatitis C without hepatic coma: Secondary | ICD-10-CM

## 2013-11-03 DIAGNOSIS — E7800 Pure hypercholesterolemia, unspecified: Secondary | ICD-10-CM

## 2013-11-03 DIAGNOSIS — Z01419 Encounter for gynecological examination (general) (routine) without abnormal findings: Secondary | ICD-10-CM | POA: Insufficient documentation

## 2013-11-03 DIAGNOSIS — D696 Thrombocytopenia, unspecified: Secondary | ICD-10-CM

## 2013-11-03 DIAGNOSIS — Z1231 Encounter for screening mammogram for malignant neoplasm of breast: Secondary | ICD-10-CM

## 2013-11-03 DIAGNOSIS — Z0000 Encounter for general adult medical examination without abnormal findings: Secondary | ICD-10-CM

## 2013-11-03 NOTE — Assessment & Plan Note (Signed)
Only mildly elevated and would not be a good candidate for a statin. Given handout on low cholesterol diet.

## 2013-11-03 NOTE — Assessment & Plan Note (Signed)
Discussed USPSTF recommendations of cervical cancer screening.  She is aware that interval of 3 years is recommended but pt would prefer to have pap smear done today.  

## 2013-11-03 NOTE — Addendum Note (Signed)
Addended by: Desmond DikeKNIGHT, Kitana Gage H on: 11/03/2013 03:02 PM   Modules accepted: Orders

## 2013-11-03 NOTE — Progress Notes (Signed)
Pre visit review using our clinic review tool, if applicable. No additional management support is needed unless otherwise documented below in the visit note. 

## 2013-11-03 NOTE — Assessment & Plan Note (Signed)
Long standing issue. Previously followed by Dr. Cyndie ChimeGranfortuna. Stable without bleeding- likely due to her liver disease. No further tx needed at this time.

## 2013-11-03 NOTE — Assessment & Plan Note (Signed)
Reviewed preventive care protocols, scheduled due services, and updated immunizations Discussed nutrition, exercise, diet, and healthy lifestyle.  Mammogram ordered. 

## 2013-11-03 NOTE — Patient Instructions (Signed)
Great to see you. Your lab work looked fantastic! I look forward to hearing about your follow up appointment with Dr. Jacqualine MauZacks.  Have a great weekend.

## 2013-11-03 NOTE — Assessment & Plan Note (Signed)
[facility] Dr. Jacqualine MauZacks. She has been vaccinated for Hepatitis B and is immune to Hepatitis A. Continue current rx.

## 2013-11-03 NOTE — Progress Notes (Signed)
Subjective:    Patient ID: Chelsea Park, female    DOB: [birth year], 58 y.o.   MRN: [id number]  HPI  1 G3P3 yo very pleasant female here for CPX.  She has no complaints today.  No h/o abnormal pap smears in past 5 years.   Last pap smear done by me on 07/27/12. No postmenopausal bleeding (LMP 14 years ago).  Denies any vaginal complaints.  No family history of breast, uterine or cervical CA.  Last mammogram 08/30/12. Mammogram scheduled for next week.  Colonoscopy- 10/05/12- Dr. Jason Fila (scanned in Epic).   5 year recall. No family h/o colon CA.  Denies any changes in her bowel habits or blood in her stool.   Hep C- diagnosed 9 years ago, she is unsure how she was exposed.  Followed by [facility].  Was last seen by Dr. Brooke Dare on 07/2013.  Fibroscan indicated F3-F4 fiborsis which would explain her  thrombocytopenia - no overt bleeding. S/p intitiation tx on 01/04/13- Sovaldi 400 mg, RBC 1200 mg daily and Pegasys 180 mcg sq weekly.  Finished this course in 03/2013.  Has follow up with Dr. Jacqualine Mau next week.  EGD on 01/03/13 was neg. Abd [place] from 01/03/13 was neg for evidence of HCC.   Lab Results  Component Value Date   ALT 21 11/01/2013   AST 23 11/01/2013   ALKPHOS 59 11/01/2013   BILITOT 0.7 11/01/2013   Lab Results  Component Value Date   WBC 4.1 11/01/2013   HGB 14.4 11/01/2013   HCT 41.8 11/01/2013   MCV 90.9 11/01/2013   PLT 72.0* 11/01/2013   Lab Results  Component Value Date   CHOL 224* 11/01/2013   HDL 67.00 11/01/2013   LDLCALC 141* 11/01/2013   TRIG 82.0 11/01/2013   CHOLHDL 3 11/01/2013   Lab Results  Component Value Date   CREATININE 0.8 11/01/2013    No current outpatient prescriptions on file prior to visit.   No current facility-administered medications on file prior to visit.    No Known Allergies  Past Medical History  Diagnosis Date  . Hepatitis C, chronic   . Urinary tract infection, site not specified   . Thrombocytopenia     No  past surgical history on file.  No family history on file.  History   Social History  . Marital Status: Married    Spouse Name: N/A    Number of Children: N/A  . Years of Education: N/A   Occupational History  . Not on file.   Social History Main Topics  . Smoking status: Never Smoker   . Smokeless tobacco: Not on file  . Alcohol Use: Yes     Comment: occasional  . Drug Use: Not on file  . Sexual Activity: Not on file   Other Topics Concern  . Not on file   Social History Narrative  . No narrative on file   The PMH, PSH, Social History, Family History, Medications, and allergies have been reviewed in [facility], and have been updated if relevant.     Review of Systems See HPI Patient reports no  vision/ hearing changes,anorexia, weight change, fever ,adenopathy, persistant / recurrent hoarseness, swallowing issues, chest pain, edema,persistant / recurrent cough, hemoptysis, dyspnea(rest, exertional, paroxysmal nocturnal), gastrointestinal  bleeding (melena, rectal bleeding), abdominal pain, excessive heart burn, GU symptoms(dysuria, hematuria, pyuria, voiding/incontinence  Issues) syncope, focal weakness, severe memory loss, concerning skin lesions, depression, anxiety, abnormal bruising/bleeding, major joint swelling, breast masses or abnormal vaginal bleeding.  Objective:   Physical Exam BP 126/74  Pulse 56  Temp(Src) 98.1 F (36.7 C) (Oral)  Ht 5' 4.75" (1.645 m)  Wt 179 lb (81.194 kg)  BMI 30.00 kg/m2  SpO2 98%   General:  Well-developed,well-nourished,in no acute distress; alert,appropriate and cooperative throughout examination Head:  normocephalic and atraumatic.   Eyes:  vision grossly intact, pupils equal, pupils round, and pupils reactive to light.   Ears:  R ear normal and L ear normal.   Nose:  no external deformity.   Mouth:  good dentition.   Neck:  No deformities, masses, or tenderness noted. Breasts:  No mass, nodules, thickening, tenderness,  bulging, retraction, inflamation, nipple discharge or skin changes noted.   Lungs:  Normal respiratory effort, chest expands symmetrically. Lungs are clear to auscultation, no crackles or wheezes. Heart:  Normal rate and regular rhythm. S1 and S2 normal without gallop, murmur, click, rub or other extra sounds. Abdomen:  Bowel sounds positive,abdomen soft and non-tender without masses, organomegaly or hernias noted. Rectal:  no external abnormalities.   Genitalia:  Pelvic Exam:        External: normal female genitalia without lesions or masses        Vagina: normal without lesions or masses        Cervix: normal without lesions or masses        Adnexa: normal bimanual exam without masses or fullness        Uterus: normal by palpation        Pap smear: performed Msk:  No deformity or scoliosis noted of thoracic or lumbar spine.   Extremities:  No clubbing, cyanosis, edema, or deformity noted with normal full range of motion of all joints.   Neurologic:  alert & oriented X3 and gait normal.   Skin:  Intact without suspicious lesions or rashes Cervical Nodes:  No lymphadenopathy noted Axillary Nodes:  No palpable lymphadenopathy Psych:  Cognition and judgment appear intact. Alert and cooperative with normal attention span and concentration. No apparent delusions, illusions, hallucinations      Assessment & Plan:

## 2013-11-07 LAB — CYTOLOGY - PAP

## 2013-11-08 ENCOUNTER — Ambulatory Visit
Admission: RE | Admit: 2013-11-08 | Discharge: 2013-11-08 | Disposition: A | Discharge status: Home / Self Care | Payer: BC Managed Care – PPO | Source: Ambulatory Visit

## 2013-11-08 ENCOUNTER — Encounter: Payer: Self-pay | Admitting: *Deleted

## 2013-11-08 DIAGNOSIS — Z1231 Encounter for screening mammogram for malignant neoplasm of breast: Secondary | ICD-10-CM

## 2013-11-21 ENCOUNTER — Encounter (HOSPITAL_COMMUNITY): Payer: Self-pay | Admitting: Emergency Medicine

## 2013-11-21 ENCOUNTER — Emergency Department (HOSPITAL_COMMUNITY)
Admission: EM | Admit: 2013-11-21 | Discharge: 2013-12-12 | Disposition: E | Payer: BC Managed Care – PPO | Attending: Emergency Medicine | Admitting: Emergency Medicine

## 2013-11-21 NOTE — ED Notes (Signed)
Family in consultation room. EDP has spoke to family to inform them patient has passed.

## 2013-11-21 NOTE — ED Notes (Signed)
Chaplain has been contacted.

## 2013-11-21 NOTE — ED Notes (Signed)
Time of Death 21:48

## 2013-11-21 NOTE — ED Notes (Signed)
EMS-per husband patient has not been feeling well for the last couple days with nausea and vomiting. Pt went to the restroom and collapsed per husband. CPR was initiated at 2110 by Fire Department. Patient was in asystole per FIRE AED. With EMS asystole. Pt was given 5 epis, 25mg  of D50, and 2mg  of Narcan and IO left tib/fib. Pt was intubated with king, vomit in tube. Patient received CPR for approx 37min when EMS contact EDP [place] for discontinue orders of CPR. Time of Death at 2148.

## 2013-11-21 NOTE — ED Notes (Signed)
Pt found on floor by patient's son, pt was unresponsive, no breathing and pulseless. 911 Operator instructed family to perform CPR on patient. EMS arrived shortly after, resumed CPR. CPR discontinued after speaking to EDP over Radio.

## 2013-11-21 NOTE — ED Provider Notes (Signed)
Mrs. Chelsea Park was brought to the Emergency Department after unsuccessful attempts of resuscitation after an unwitnessed arrest.    By EMS report, they found the patient to by in asystole on their arrival.  They performed CPR for approximately 40 minutes, provided multiple rounds of medications through IO and IV lines.  She remained in asystole throughout the code.  EMS felt that further resuscitative efforts were futile and they called me requesting a DC CPR order, which I granted.    Approximately 15 minutes after CPR was discontinued, she arrived in the ED and I was asked to evaluate her.  On exam, she was unresponsive, GCS 3, had no respiratory effort, had no pulses, had a king airway filled with brown liquid in her oropharynx, had blood from bilateral nares.   I informed her death with her family.  They provided additional information, stating that she had been feeling poorly for a few days with primary complaint of nausea and GI upset.  Her son saw a part of her body as she was sitting on the toilet.  He called out to her but she did not answer.  A few minutes later, he came back and found her on the floor unresponsive.  The family then called 911 and began chest compressions until EMS arrived.    I discussed her case with the medical examiner who stated she would not be a medical examiner case.   I discussed her case with Dr. Dan HumphreysWalker, [place] call [place].  I informed her of the patient's death.  Dr. Dan HumphreysWalker agreed to have Dr. Dayton MartesAron complete her Death Certificate.     Chelsea ChurnJohn David Milton Streicher III, Chelsea Park 10-11-2013 1325

## 2013-11-21 NOTE — ED Notes (Signed)
[facility] reports patient is not a candidate to donate any tissue.

## 2013-11-22 ENCOUNTER — Telehealth: Payer: Self-pay | Admitting: Family Medicine

## 2013-11-23 ENCOUNTER — Other Ambulatory Visit: Payer: Self-pay | Admitting: Family Medicine

## 2013-12-12 DIAGNOSIS — 419620001 Death: Secondary | SNOMED CT | POA: Insufficient documentation

## 2013-12-12 NOTE — Telephone Encounter (Signed)
Sympathy card sent 

## 2013-12-12 DEATH — deceased

## 2014-01-03 ENCOUNTER — Telehealth: Payer: Self-pay | Admitting: *Deleted

## 2014-01-03 NOTE — Telephone Encounter (Signed)
Pt husband came into office requesting a copy of pts autopsy report. After review of pts chart, she never signed a DPR authorizing the release of  her medical information to anyone. Per Hansel Starling pt is unable to obtain a copy, but may attempt to receive one from the hospital autopsy was performed.

## 2014-06-08 IMAGING — CR DG FOOT COMPLETE 3+V*L*
3 series · 3 of 3 positions shown · non-contrast
Comparison: None.

CLINICAL DATA: Left foot pain status post fall

EXAM:
LEFT FOOT - COMPLETE 3+ VIEW

[view not recorded (1 of 3)]
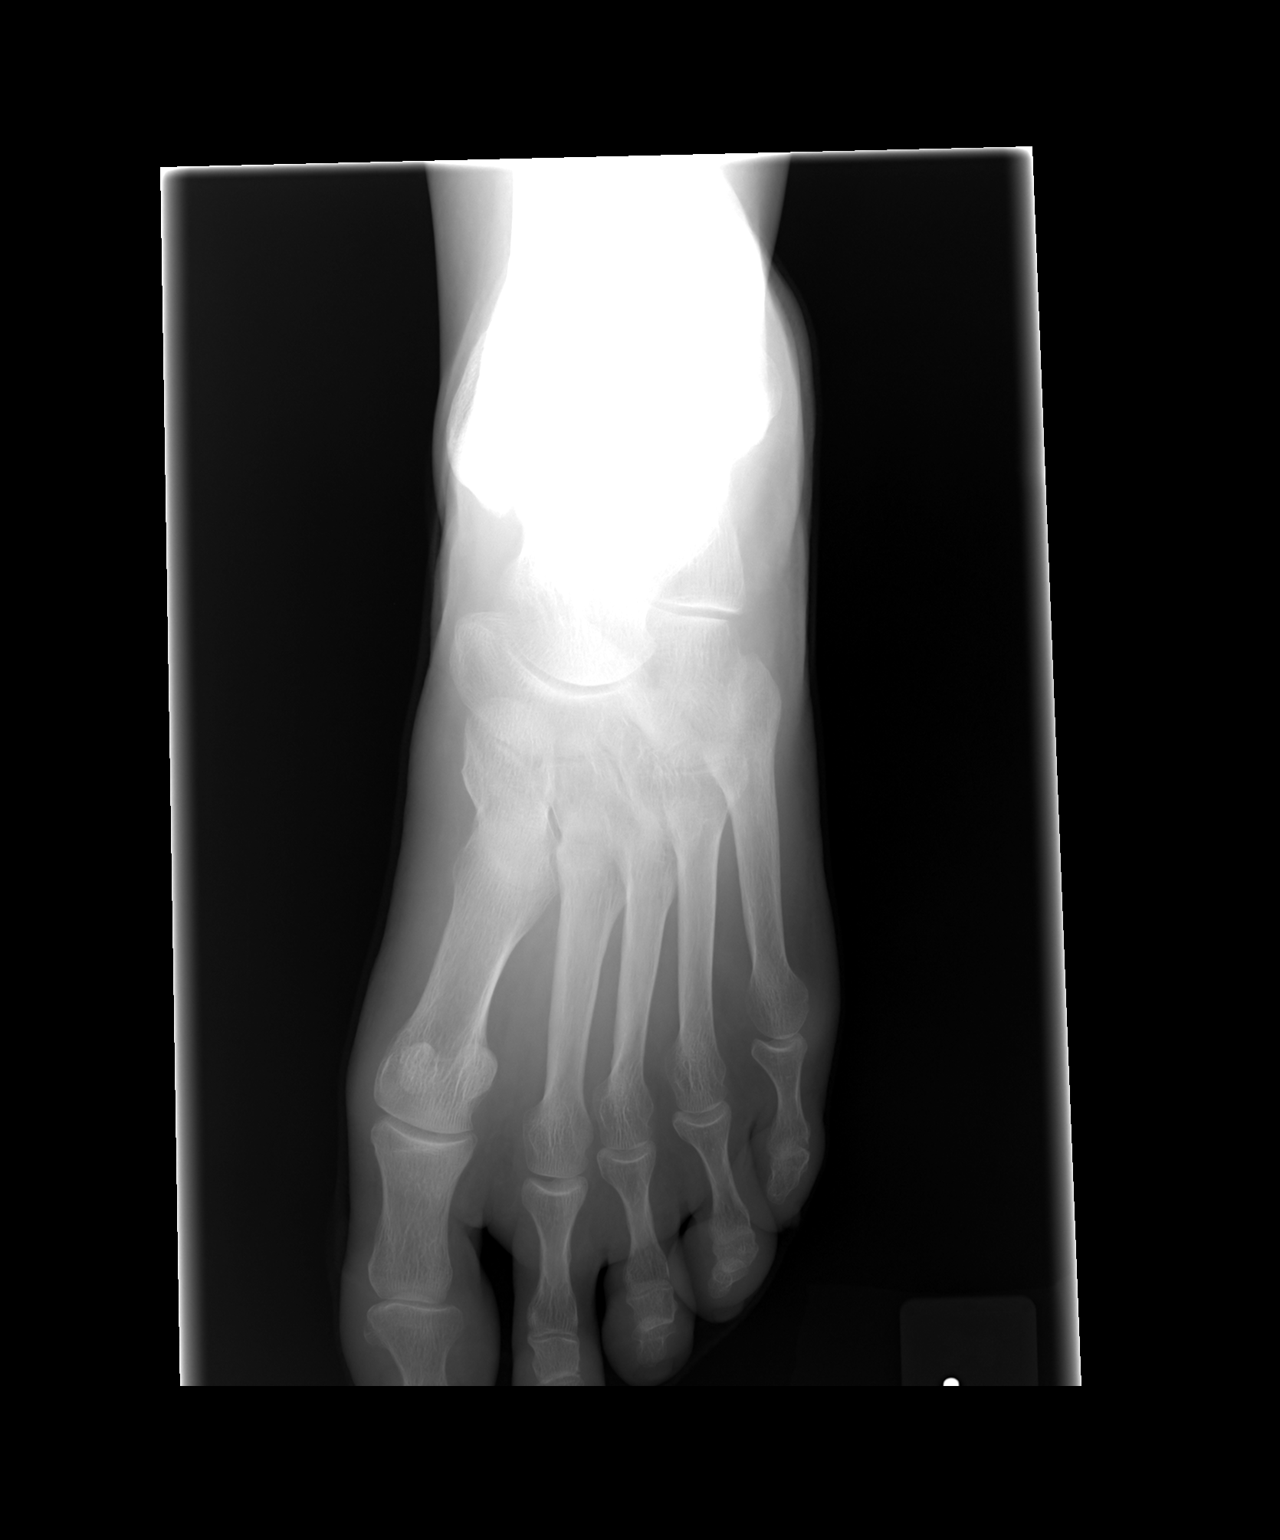

[view not recorded (2 of 3)]
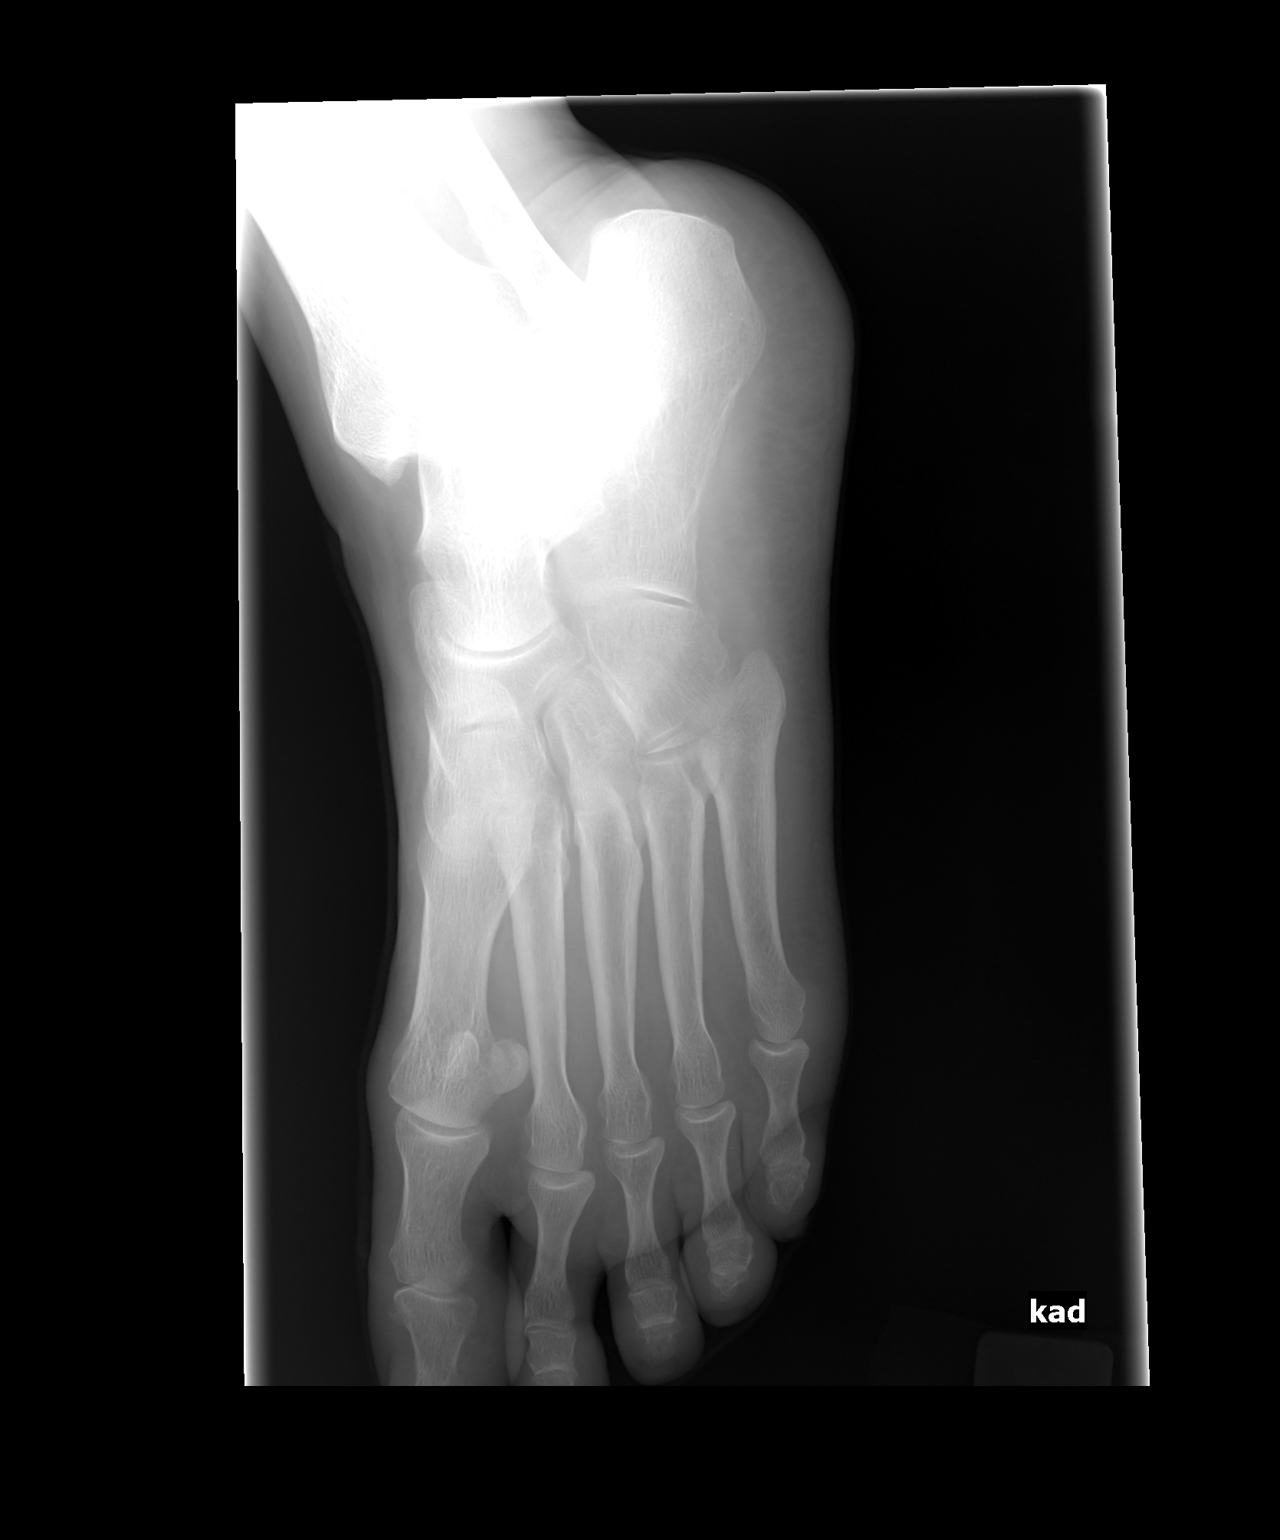

[view not recorded (3 of 3)]
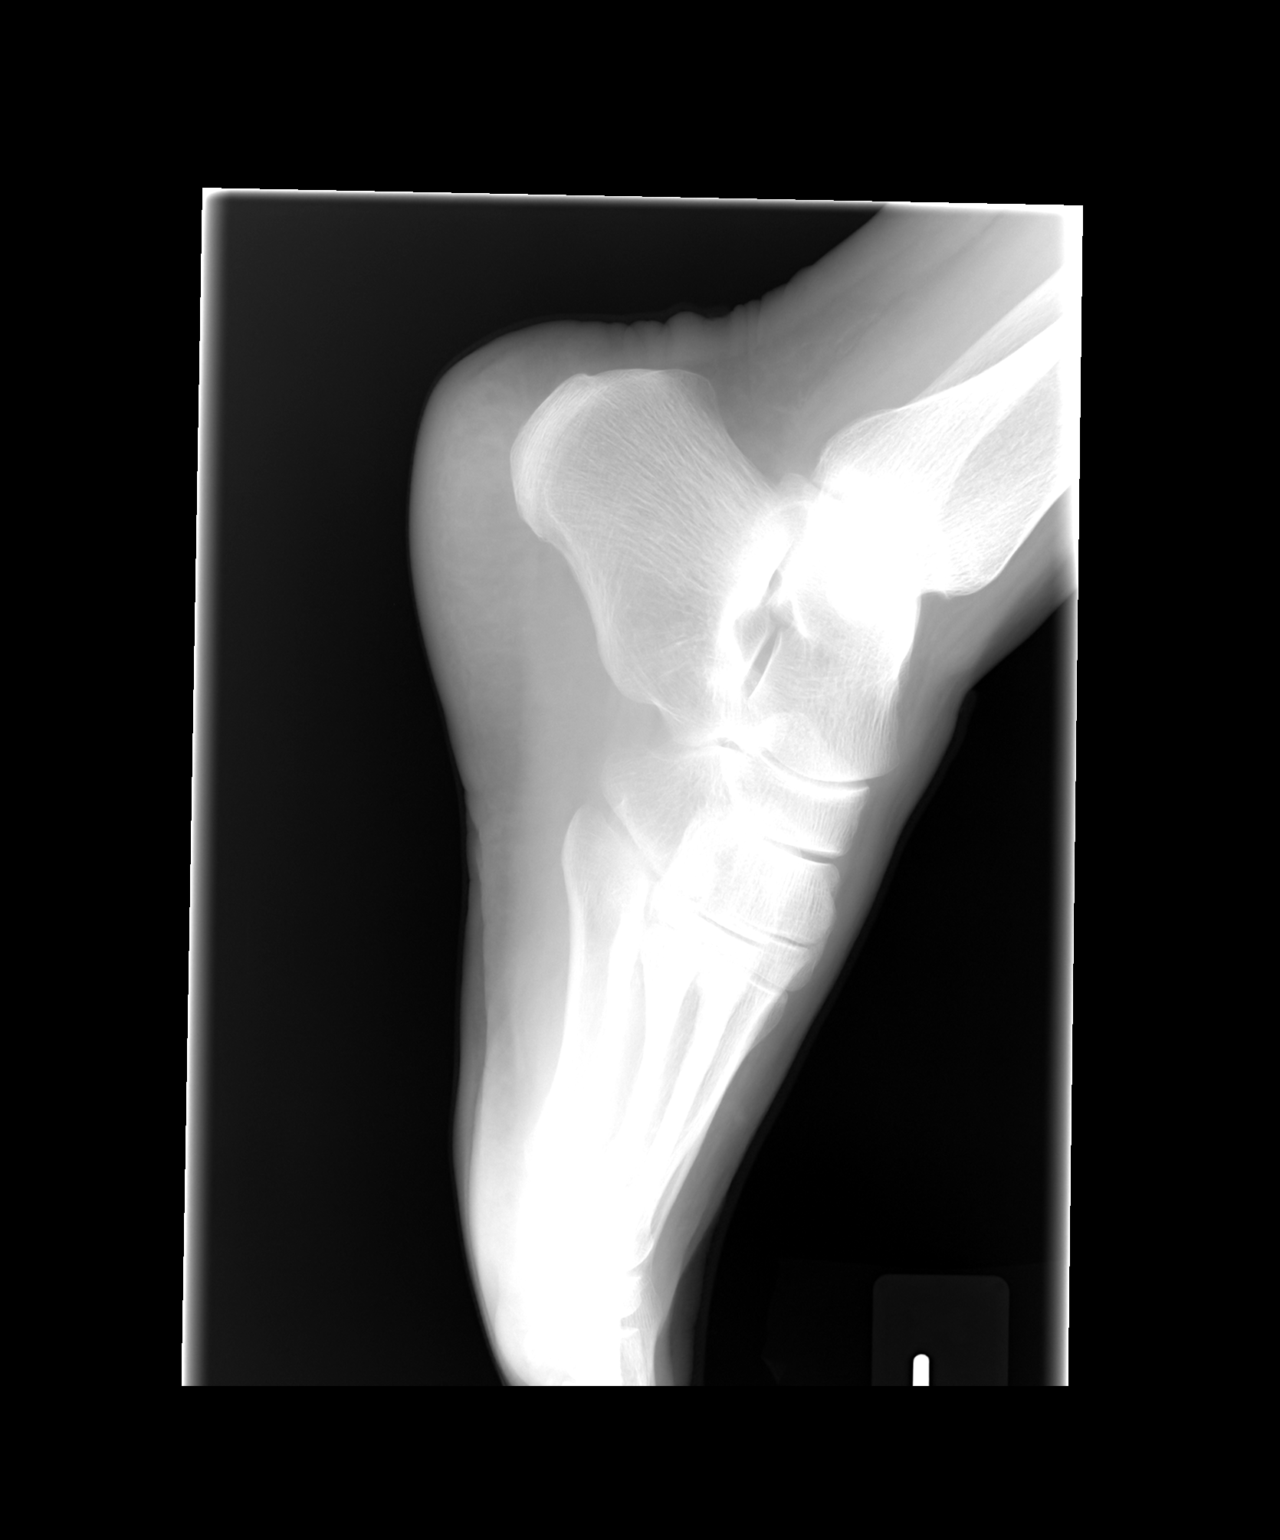

[3 of 3 positions shown; findings below may reference images not displayed]

FINDINGS: The bones are adequately mineralized. There is no acute fracture nor
dislocation. The overlying soft tissues are normal in appearance.
IMPRESSION: There is no acute bony abnormality of the left foot.
